# Patient Record
Sex: Female | Born: 1980 | Race: White | Hispanic: No
Health system: Southern US, Community
[De-identification: ages and names within clinical notes are randomized; demographics above are authoritative.]

---

## 2000-11-23 ENCOUNTER — Emergency Department (HOSPITAL_COMMUNITY): Admission: EM | Admit: 2000-11-23 | Discharge: 2000-11-23 | Payer: Self-pay | Admitting: Emergency Medicine

## 2001-09-26 ENCOUNTER — Encounter: Payer: Self-pay | Admitting: Emergency Medicine

## 2001-09-26 ENCOUNTER — Emergency Department (HOSPITAL_COMMUNITY): Admission: EM | Admit: 2001-09-26 | Discharge: 2001-09-26 | Payer: Self-pay | Admitting: Emergency Medicine

## 2001-11-29 ENCOUNTER — Emergency Department (HOSPITAL_COMMUNITY): Admission: EM | Admit: 2001-11-29 | Discharge: 2001-11-29 | Payer: Self-pay

## 2001-12-22 ENCOUNTER — Encounter: Payer: Self-pay | Admitting: Emergency Medicine

## 2001-12-22 ENCOUNTER — Emergency Department (HOSPITAL_COMMUNITY): Admission: EM | Admit: 2001-12-22 | Discharge: 2001-12-22 | Payer: Self-pay | Admitting: Emergency Medicine

## 2003-05-05 ENCOUNTER — Emergency Department (HOSPITAL_COMMUNITY): Admission: EM | Admit: 2003-05-05 | Discharge: 2003-05-05 | Payer: Self-pay | Admitting: Emergency Medicine

## 2003-06-02 ENCOUNTER — Emergency Department (HOSPITAL_COMMUNITY): Admission: EM | Admit: 2003-06-02 | Discharge: 2003-06-02 | Payer: Self-pay | Admitting: Emergency Medicine

## 2004-03-06 ENCOUNTER — Encounter (INDEPENDENT_AMBULATORY_CARE_PROVIDER_SITE_OTHER): Payer: Self-pay | Admitting: *Deleted

## 2004-03-06 LAB — CONVERTED CEMR LAB

## 2004-10-05 ENCOUNTER — Ambulatory Visit: Payer: Self-pay | Admitting: *Deleted

## 2004-10-14 ENCOUNTER — Ambulatory Visit: Payer: Self-pay

## 2004-10-14 ENCOUNTER — Ambulatory Visit: Payer: Self-pay | Admitting: *Deleted

## 2004-10-19 ENCOUNTER — Ambulatory Visit: Payer: Self-pay

## 2004-11-09 ENCOUNTER — Ambulatory Visit: Payer: Self-pay | Admitting: Family Medicine

## 2004-12-07 ENCOUNTER — Encounter (INDEPENDENT_AMBULATORY_CARE_PROVIDER_SITE_OTHER): Payer: Self-pay | Admitting: Specialist

## 2004-12-08 ENCOUNTER — Inpatient Hospital Stay (HOSPITAL_COMMUNITY): Admission: RE | Admit: 2004-12-08 | Discharge: 2004-12-09 | Payer: Self-pay | Admitting: General Surgery

## 2005-09-07 ENCOUNTER — Emergency Department: Payer: Self-pay | Admitting: Internal Medicine

## 2005-12-29 IMAGING — CR DG KNEE COMPLETE 4+V*L*
4 series · 4 of 4 positions shown · non-contrast
Comparison: none

CLINICAL DATA: Pain after fall.
 FOUR VIEW LEFT KNEE  
 There is no evidence of bone, joint, or soft tissue abnormality. 
 IMPRESSION
 Normal left knee series.

[view not recorded (1 of 4)]
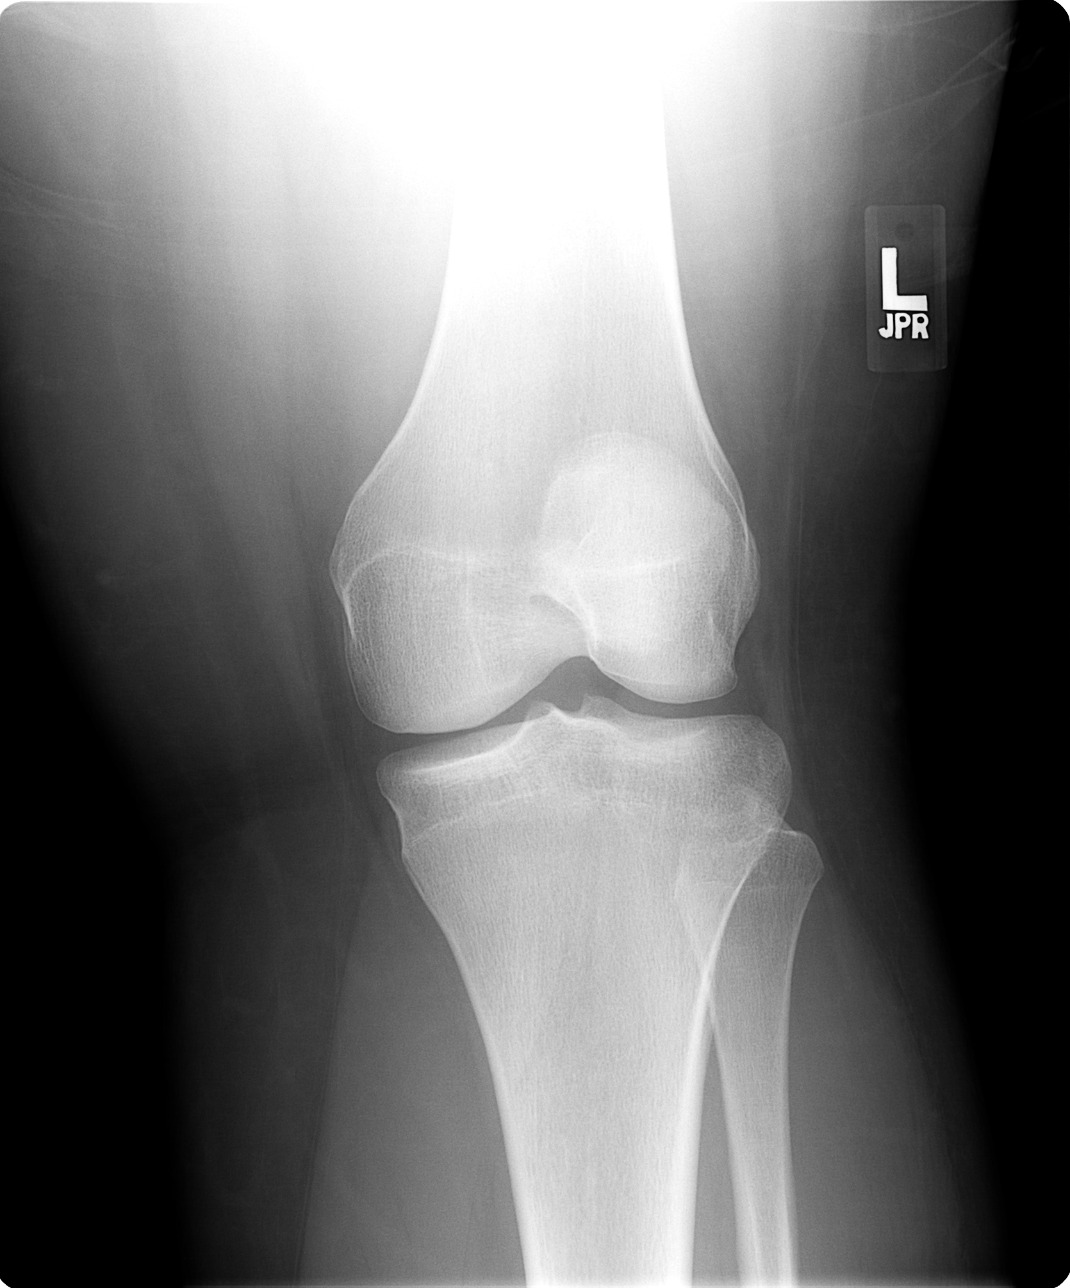

[view not recorded (2 of 4)]
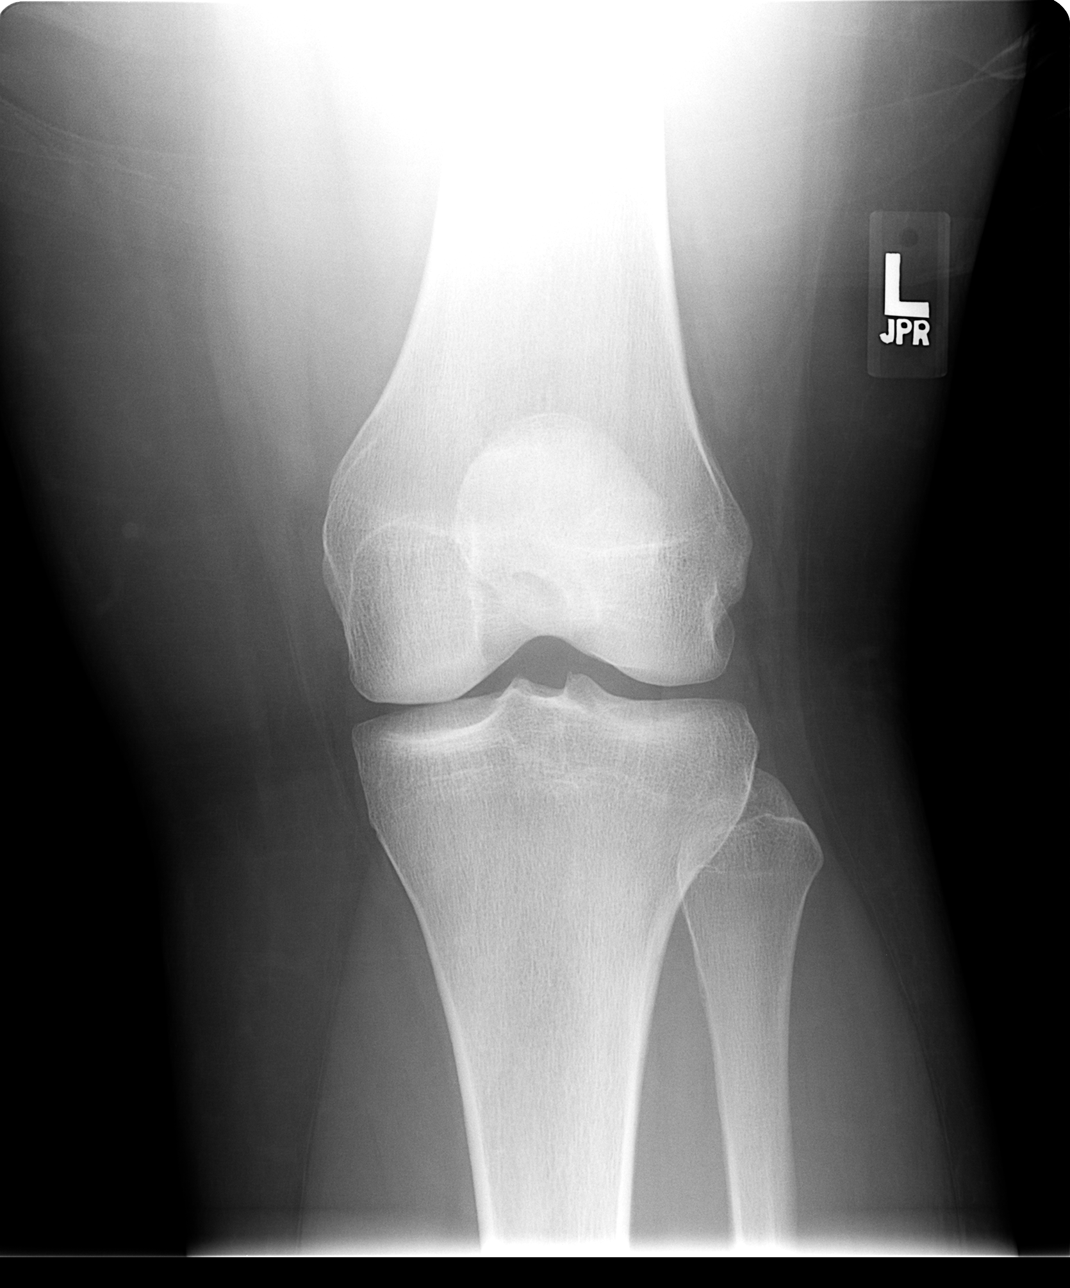

[view not recorded (3 of 4)]
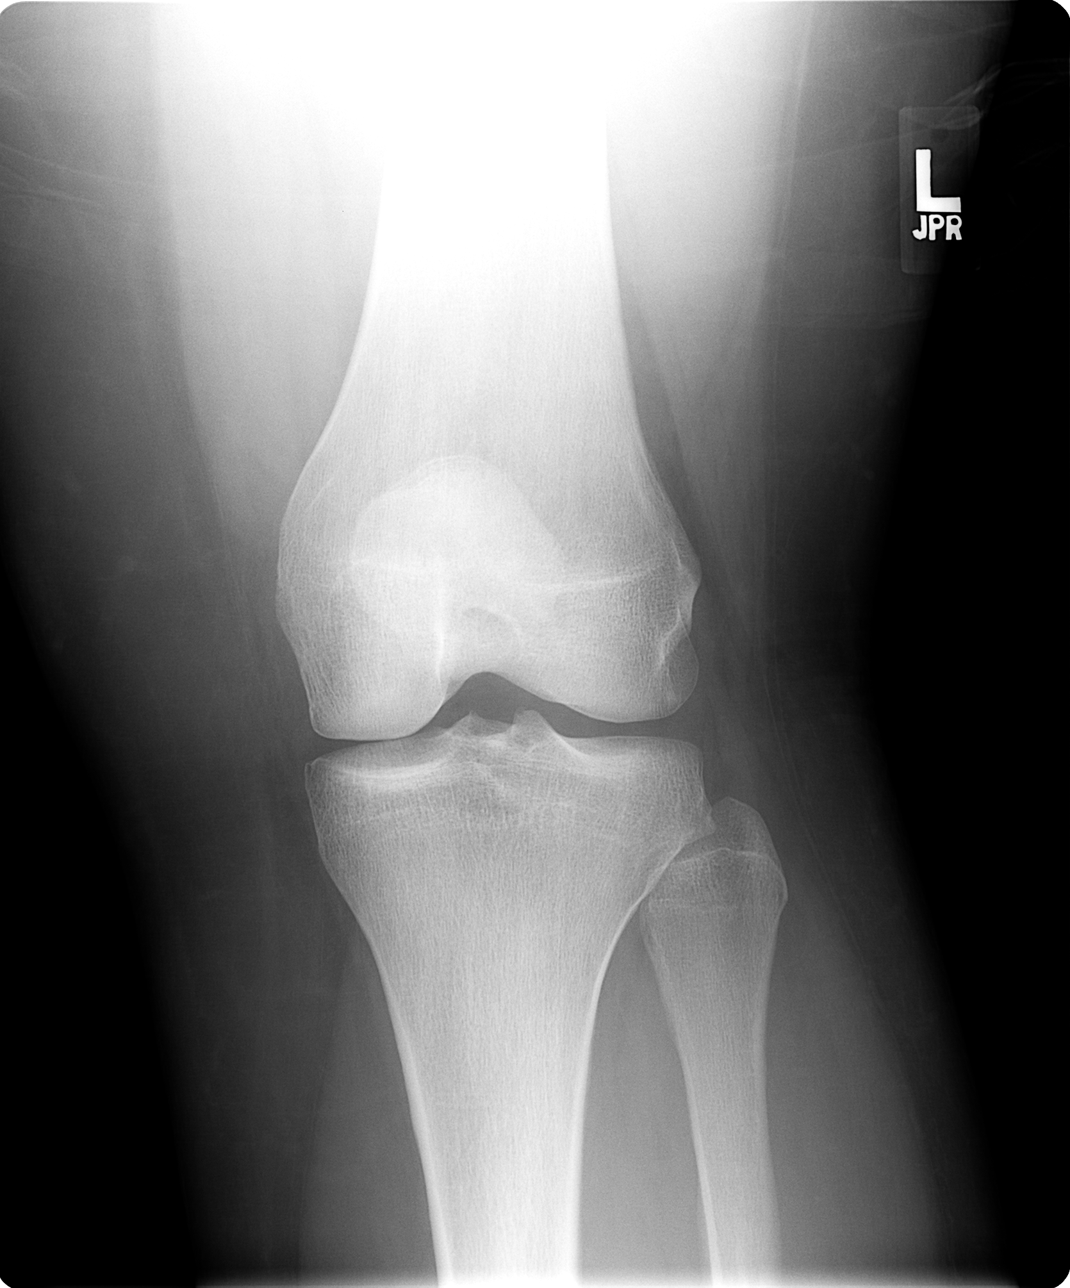

[view not recorded (4 of 4)]
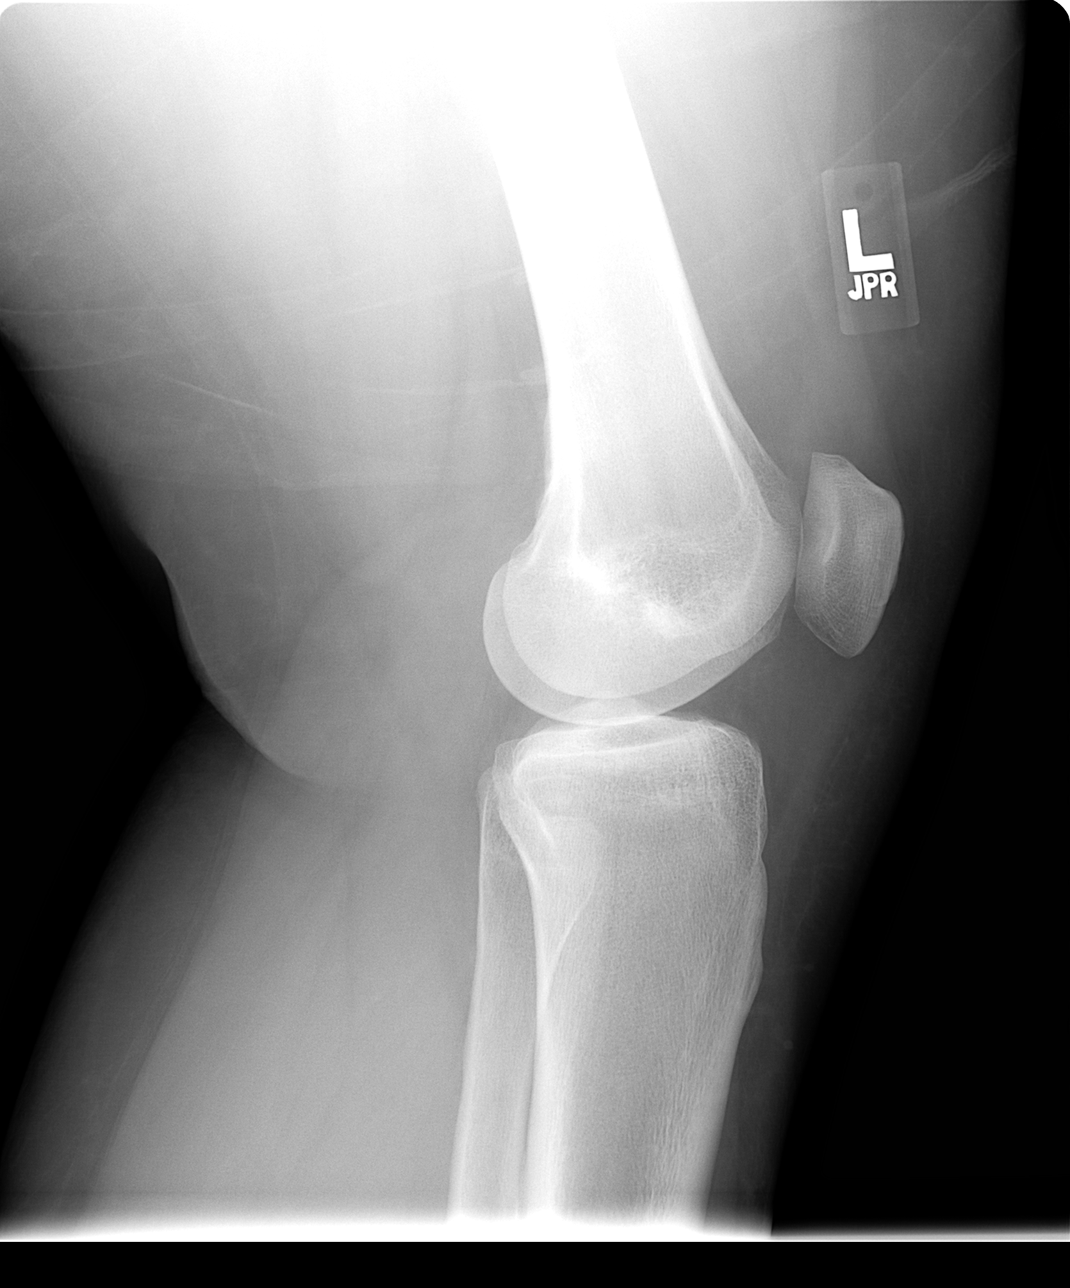

[4 of 4 positions shown; findings below may reference images not displayed]

## 2006-05-03 DIAGNOSIS — K8020 Calculus of gallbladder without cholecystitis without obstruction: Secondary | ICD-10-CM | POA: Insufficient documentation

## 2006-05-03 DIAGNOSIS — I10 Essential (primary) hypertension: Secondary | ICD-10-CM | POA: Insufficient documentation

## 2006-05-03 DIAGNOSIS — E669 Obesity, unspecified: Secondary | ICD-10-CM | POA: Insufficient documentation

## 2006-05-04 ENCOUNTER — Encounter (INDEPENDENT_AMBULATORY_CARE_PROVIDER_SITE_OTHER): Payer: Self-pay | Admitting: *Deleted

## 2008-08-20 ENCOUNTER — Emergency Department (HOSPITAL_COMMUNITY): Admission: EM | Admit: 2008-08-20 | Discharge: 2008-08-21 | Payer: Self-pay | Admitting: Emergency Medicine

## 2008-10-19 ENCOUNTER — Ambulatory Visit: Payer: Self-pay | Admitting: Endocrinology

## 2008-10-19 DIAGNOSIS — E119 Type 2 diabetes mellitus without complications: Secondary | ICD-10-CM | POA: Insufficient documentation

## 2008-11-11 ENCOUNTER — Emergency Department (HOSPITAL_COMMUNITY): Admission: EM | Admit: 2008-11-11 | Discharge: 2008-11-11 | Payer: Self-pay | Admitting: Emergency Medicine

## 2008-11-17 ENCOUNTER — Emergency Department (HOSPITAL_COMMUNITY): Admission: EM | Admit: 2008-11-17 | Discharge: 2008-11-17 | Payer: Self-pay | Admitting: Emergency Medicine

## 2008-11-28 ENCOUNTER — Emergency Department (HOSPITAL_COMMUNITY): Admission: EM | Admit: 2008-11-28 | Discharge: 2008-11-28 | Payer: Self-pay | Admitting: Emergency Medicine

## 2008-11-30 ENCOUNTER — Ambulatory Visit: Payer: Self-pay | Admitting: Endocrinology

## 2008-12-14 ENCOUNTER — Ambulatory Visit: Payer: Self-pay | Admitting: Endocrinology

## 2009-01-13 ENCOUNTER — Emergency Department (HOSPITAL_COMMUNITY): Admission: EM | Admit: 2009-01-13 | Discharge: 2009-01-13 | Payer: Self-pay | Admitting: Emergency Medicine

## 2009-03-26 ENCOUNTER — Emergency Department (HOSPITAL_COMMUNITY): Admission: EM | Admit: 2009-03-26 | Discharge: 2009-03-26 | Payer: Self-pay | Admitting: Emergency Medicine

## 2009-04-23 ENCOUNTER — Observation Stay (HOSPITAL_COMMUNITY): Admission: EM | Admit: 2009-04-23 | Discharge: 2009-04-23 | Payer: Self-pay | Admitting: Emergency Medicine

## 2009-07-21 ENCOUNTER — Observation Stay (HOSPITAL_COMMUNITY): Admission: EM | Admit: 2009-07-21 | Discharge: 2009-07-21 | Payer: Self-pay | Admitting: Emergency Medicine

## 2009-08-31 ENCOUNTER — Emergency Department (HOSPITAL_COMMUNITY): Admission: EM | Admit: 2009-08-31 | Discharge: 2009-08-31 | Payer: Self-pay | Admitting: Emergency Medicine

## 2010-02-10 ENCOUNTER — Emergency Department (HOSPITAL_COMMUNITY): Admission: EM | Admit: 2010-02-10 | Discharge: 2009-03-18 | Payer: Self-pay | Admitting: Emergency Medicine

## 2010-05-21 LAB — DIFFERENTIAL
Lymphocytes Relative: 27 % (ref 12–46)
Lymphs Abs: 2 10*3/uL (ref 0.7–4.0)
Monocytes Absolute: 0.4 10*3/uL (ref 0.1–1.0)
Monocytes Relative: 6 % (ref 3–12)
Neutro Abs: 4.6 10*3/uL (ref 1.7–7.7)

## 2010-05-21 LAB — POCT I-STAT 3, VENOUS BLOOD GAS (G3P V)
Acid-Base Excess: 2 mmol/L (ref 0.0–2.0)
Bicarbonate: 25.9 mEq/L — ABNORMAL HIGH (ref 20.0–24.0)
O2 Saturation: 97 %
TCO2: 27 mmol/L (ref 0–100)
pO2, Ven: 84 mmHg — ABNORMAL HIGH (ref 30.0–45.0)

## 2010-05-21 LAB — URINALYSIS, ROUTINE W REFLEX MICROSCOPIC
Hgb urine dipstick: NEGATIVE
Leukocytes, UA: NEGATIVE
Protein, ur: NEGATIVE mg/dL
Urobilinogen, UA: 0.2 mg/dL (ref 0.0–1.0)

## 2010-05-21 LAB — CBC
HCT: 42.4 % (ref 36.0–46.0)
Hemoglobin: 14.8 g/dL (ref 12.0–15.0)
MCHC: 34.9 g/dL (ref 30.0–36.0)
MCV: 87.6 fL (ref 78.0–100.0)
RBC: 4.84 MIL/uL (ref 3.87–5.11)
RDW: 12.3 % (ref 11.5–15.5)

## 2010-05-21 LAB — URINE CULTURE

## 2010-05-21 LAB — URINE MICROSCOPIC-ADD ON

## 2010-05-21 LAB — POCT I-STAT, CHEM 8
Chloride: 97 mEq/L (ref 96–112)
Creatinine, Ser: 0.4 mg/dL (ref 0.4–1.2)
Glucose, Bld: 473 mg/dL — ABNORMAL HIGH (ref 70–99)
Hemoglobin: 15 g/dL (ref 12.0–15.0)
Potassium: 3.2 mEq/L — ABNORMAL LOW (ref 3.5–5.1)

## 2010-05-21 LAB — GLUCOSE, CAPILLARY
Glucose-Capillary: 300 mg/dL — ABNORMAL HIGH (ref 70–99)
Glucose-Capillary: 428 mg/dL — ABNORMAL HIGH (ref 70–99)

## 2010-05-22 LAB — BASIC METABOLIC PANEL
Calcium: 8.4 mg/dL (ref 8.4–10.5)
GFR calc non Af Amer: >60 mL/min (ref >60)
Sodium: 135 mEq/L (ref 135–145)

## 2010-05-22 LAB — DIFFERENTIAL
Basophils Absolute: 0.2 10*3/uL — ABNORMAL HIGH (ref 0.0–0.1)
Eosinophils Relative: 3 % (ref 0–5)
Lymphocytes Relative: 39 % (ref 12–46)
Monocytes Absolute: 0.4 10*3/uL (ref 0.1–1.0)
Monocytes Relative: 5 % (ref 3–12)

## 2010-05-22 LAB — URINALYSIS, ROUTINE W REFLEX MICROSCOPIC
Glucose, UA: >1000 mg/dL — AB
Protein, ur: NEGATIVE mg/dL

## 2010-05-22 LAB — CBC
HCT: 41.5 % (ref 36.0–46.0)
Hemoglobin: 14.4 g/dL (ref 12.0–15.0)
MCH: 30 pg (ref 26.0–34.0)
MCV: 86.7 fL (ref 78.0–100.0)
RBC: 4.79 MIL/uL (ref 3.87–5.11)

## 2010-05-22 LAB — URINE MICROSCOPIC-ADD ON

## 2010-05-23 LAB — COMPREHENSIVE METABOLIC PANEL
AST: 60 U/L — ABNORMAL HIGH (ref 0–37)
BUN: 5 mg/dL — ABNORMAL LOW (ref 6–23)
CO2: 27 mEq/L (ref 19–32)
Calcium: 8.6 mg/dL (ref 8.4–10.5)
Creatinine, Ser: 0.55 mg/dL (ref 0.4–1.2)
GFR calc Af Amer: >60 mL/min (ref >60)
GFR calc non Af Amer: >60 mL/min (ref >60)
Glucose, Bld: 288 mg/dL — ABNORMAL HIGH (ref 70–99)
Total Bilirubin: 0.5 mg/dL (ref 0.3–1.2)

## 2010-05-23 LAB — CBC
HCT: 41.9 % (ref 36.0–46.0)
MCHC: 34.2 g/dL (ref 30.0–36.0)
MCV: 88.4 fL (ref 78.0–100.0)
RBC: 4.73 MIL/uL (ref 3.87–5.11)

## 2010-05-23 LAB — URINE MICROSCOPIC-ADD ON

## 2010-05-23 LAB — DIFFERENTIAL
Basophils Absolute: 0.2 10*3/uL — ABNORMAL HIGH (ref 0.0–0.1)
Lymphocytes Relative: 30 % (ref 12–46)
Lymphs Abs: 3.3 10*3/uL (ref 0.7–4.0)
Neutro Abs: 6.8 10*3/uL (ref 1.7–7.7)
Neutrophils Relative %: 60 % (ref 43–77)

## 2010-05-23 LAB — URINALYSIS, ROUTINE W REFLEX MICROSCOPIC
Nitrite: NEGATIVE
Protein, ur: NEGATIVE mg/dL
Specific Gravity, Urine: 1.026 (ref 1.005–1.030)
Urobilinogen, UA: 0.2 mg/dL (ref 0.0–1.0)

## 2010-05-23 LAB — LIPASE, BLOOD: Lipase: 22 U/L (ref 11–59)

## 2010-05-23 LAB — GLUCOSE, CAPILLARY
Glucose-Capillary: 243 mg/dL — ABNORMAL HIGH (ref 70–99)
Glucose-Capillary: 379 mg/dL — ABNORMAL HIGH (ref 70–99)

## 2010-05-23 LAB — POCT PREGNANCY, URINE: Preg Test, Ur: NEGATIVE

## 2010-05-25 LAB — COMPREHENSIVE METABOLIC PANEL
BUN: 6 mg/dL (ref 6–23)
CO2: 25 mEq/L (ref 19–32)
Calcium: 9.3 mg/dL (ref 8.4–10.5)
Creatinine, Ser: 0.69 mg/dL (ref 0.4–1.2)
GFR calc non Af Amer: >60 mL/min (ref >60)
Glucose, Bld: 416 mg/dL — ABNORMAL HIGH (ref 70–99)
Total Protein: 7.3 g/dL (ref 6.0–8.3)

## 2010-05-25 LAB — URINALYSIS, ROUTINE W REFLEX MICROSCOPIC
Glucose, UA: >1000 mg/dL — AB
Hgb urine dipstick: NEGATIVE
Leukocytes, UA: NEGATIVE
Protein, ur: NEGATIVE mg/dL
Specific Gravity, Urine: 1.025 (ref 1.005–1.030)
Urobilinogen, UA: 0.2 mg/dL (ref 0.0–1.0)

## 2010-05-25 LAB — GLUCOSE, CAPILLARY
Glucose-Capillary: 360 mg/dL — ABNORMAL HIGH (ref 70–99)
Glucose-Capillary: 388 mg/dL — ABNORMAL HIGH (ref 70–99)

## 2010-05-25 LAB — DIFFERENTIAL
Eosinophils Absolute: 0.5 10*3/uL (ref 0.0–0.7)
Lymphocytes Relative: 22 % (ref 12–46)
Lymphs Abs: 2.8 10*3/uL (ref 0.7–4.0)
Monocytes Relative: 4 % (ref 3–12)
Neutro Abs: 9.1 10*3/uL — ABNORMAL HIGH (ref 1.7–7.7)
Neutrophils Relative %: 70 % (ref 43–77)

## 2010-05-25 LAB — LIPASE, BLOOD: Lipase: 17 U/L (ref 11–59)

## 2010-05-25 LAB — URINE MICROSCOPIC-ADD ON

## 2010-05-25 LAB — CBC
Hemoglobin: 14.7 g/dL (ref 12.0–15.0)
MCHC: 34 g/dL (ref 30.0–36.0)
MCV: 88.9 fL (ref 78.0–100.0)
RBC: 4.87 MIL/uL (ref 3.87–5.11)
RDW: 12.8 % (ref 11.5–15.5)

## 2010-05-25 LAB — POCT PREGNANCY, URINE: Preg Test, Ur: NEGATIVE

## 2010-06-08 LAB — DIFFERENTIAL
Basophils Absolute: 0.2 10*3/uL — ABNORMAL HIGH (ref 0.0–0.1)
Basophils Relative: 2 % — ABNORMAL HIGH (ref 0–1)
Eosinophils Absolute: 0.4 10*3/uL (ref 0.0–0.7)
Monocytes Relative: 4 % (ref 3–12)
Neutro Abs: 5.1 10*3/uL (ref 1.7–7.7)
Neutrophils Relative %: 54 % (ref 43–77)

## 2010-06-08 LAB — BASIC METABOLIC PANEL
BUN: 4 mg/dL — ABNORMAL LOW (ref 6–23)
CO2: 26 mEq/L (ref 19–32)
Calcium: 8.5 mg/dL (ref 8.4–10.5)
Creatinine, Ser: 0.61 mg/dL (ref 0.4–1.2)
GFR calc Af Amer: >60 mL/min (ref >60)

## 2010-06-08 LAB — POCT CARDIAC MARKERS
CKMB, poc: <1 ng/mL — ABNORMAL LOW (ref 1.0–8.0)
Myoglobin, poc: 42.8 ng/mL (ref 12–200)
Troponin i, poc: <0.05 ng/mL (ref 0.00–0.09)

## 2010-06-08 LAB — CBC
MCHC: 34.8 g/dL (ref 30.0–36.0)
Platelets: 215 10*3/uL (ref 150–400)
RBC: 4.73 MIL/uL (ref 3.87–5.11)
RDW: 12.8 % (ref 11.5–15.5)

## 2010-06-10 LAB — URINALYSIS, ROUTINE W REFLEX MICROSCOPIC
Bilirubin Urine: NEGATIVE
Bilirubin Urine: NEGATIVE
Glucose, UA: >1000 mg/dL — AB
Glucose, UA: >1000 mg/dL — AB
Hgb urine dipstick: NEGATIVE
Hgb urine dipstick: NEGATIVE
Ketones, ur: NEGATIVE mg/dL
Ketones, ur: NEGATIVE mg/dL
Leukocytes, UA: NEGATIVE
Nitrite: NEGATIVE
Protein, ur: NEGATIVE mg/dL
Protein, ur: NEGATIVE mg/dL
Specific Gravity, Urine: 1.027 (ref 1.005–1.030)
Urobilinogen, UA: 0.2 mg/dL (ref 0.0–1.0)
pH: 6 (ref 5.0–8.0)
pH: 6 (ref 5.0–8.0)

## 2010-06-10 LAB — GLUCOSE, CAPILLARY
Glucose-Capillary: 337 mg/dL — ABNORMAL HIGH (ref 70–99)
Glucose-Capillary: 353 mg/dL — ABNORMAL HIGH (ref 70–99)
Glucose-Capillary: 359 mg/dL — ABNORMAL HIGH (ref 70–99)
Glucose-Capillary: 231 mg/dL — ABNORMAL HIGH (ref 70–99)
Glucose-Capillary: 440 mg/dL — ABNORMAL HIGH (ref 70–99)

## 2010-06-10 LAB — URINE MICROSCOPIC-ADD ON

## 2010-06-10 LAB — POCT PREGNANCY, URINE
Preg Test, Ur: NEGATIVE
Preg Test, Ur: NEGATIVE
Preg Test, Ur: NEGATIVE

## 2010-06-10 LAB — POCT I-STAT, CHEM 8
BUN: 4 mg/dL — ABNORMAL LOW (ref 6–23)
BUN: 6 mg/dL (ref 6–23)
Calcium, Ion: 1.1 mmol/L — ABNORMAL LOW (ref 1.12–1.32)
Calcium, Ion: 1.12 mmol/L (ref 1.12–1.32)
Chloride: 98 mEq/L (ref 96–112)
Chloride: 98 mEq/L (ref 96–112)
Creatinine, Ser: 0.4 mg/dL (ref 0.4–1.2)
Creatinine, Ser: 0.6 mg/dL (ref 0.4–1.2)
Creatinine, Ser: 0.6 mg/dL (ref 0.4–1.2)
HCT: 49 % — ABNORMAL HIGH (ref 36.0–46.0)
Hemoglobin: 16.7 g/dL — ABNORMAL HIGH (ref 12.0–15.0)
Potassium: 3.6 mEq/L (ref 3.5–5.1)
Sodium: 137 mEq/L (ref 135–145)
TCO2: 26 mmol/L (ref 0–100)
TCO2: 24 mmol/L (ref 0–100)

## 2010-06-10 LAB — POCT I-STAT 3, VENOUS BLOOD GAS (G3P V)
Acid-Base Excess: 5 mmol/L — ABNORMAL HIGH (ref 0.0–2.0)
Bicarbonate: 29.5 mEq/L — ABNORMAL HIGH (ref 20.0–24.0)
O2 Saturation: 79 %
TCO2: 31 mmol/L (ref 0–100)

## 2010-06-10 LAB — KETONES, QUALITATIVE: Acetone, Bld: NEGATIVE

## 2010-06-10 LAB — CBC
HCT: 42.2 % (ref 36.0–46.0)
HCT: 44 % (ref 36.0–46.0)
Hemoglobin: 14.4 g/dL (ref 12.0–15.0)
Hemoglobin: 14.8 g/dL (ref 12.0–15.0)
MCV: 87.4 fL (ref 78.0–100.0)
MCV: 88.1 fL (ref 78.0–100.0)
MCV: 88.7 fL (ref 78.0–100.0)
Platelets: 285 10*3/uL (ref 150–400)
RBC: 4.82 MIL/uL (ref 3.87–5.11)
RDW: 12.4 % (ref 11.5–15.5)
WBC: 9.2 10*3/uL (ref 4.0–10.5)
WBC: 10.7 10*3/uL — ABNORMAL HIGH (ref 4.0–10.5)

## 2010-06-10 LAB — DIFFERENTIAL
Basophils Absolute: 0.1 10*3/uL (ref 0.0–0.1)
Eosinophils Absolute: 0.3 10*3/uL (ref 0.0–0.7)
Eosinophils Absolute: 0.4 10*3/uL (ref 0.0–0.7)
Eosinophils Relative: 3 % (ref 0–5)
Eosinophils Relative: 4 % (ref 0–5)
Lymphocytes Relative: 39 % (ref 12–46)
Lymphocytes Relative: 32 % (ref 12–46)
Lymphs Abs: 3.6 10*3/uL (ref 0.7–4.0)
Lymphs Abs: 3.3 10*3/uL (ref 0.7–4.0)
Lymphs Abs: 3.4 10*3/uL (ref 0.7–4.0)
Monocytes Absolute: 0.3 10*3/uL (ref 0.1–1.0)
Monocytes Relative: 5 % (ref 3–12)
Neutro Abs: 6.4 10*3/uL (ref 1.7–7.7)
Neutrophils Relative %: 60 % (ref 43–77)

## 2010-07-22 NOTE — Op Note (Signed)
NAME:  Mary Hahn, Mary Hahn                  ACCOUNT NO.:  [id number]   MEDICAL RECORD NO.:  [id number]          PATIENT TYPE:  AMB   LOCATION:  SDS                          FACILITY:  [facility]   PHYSICIAN:  Ollen Gross. Vernell Morgans, M.D. DATE OF BIRTH:  Jun 25, 1980   DATE OF PROCEDURE:  12/07/2004  DATE OF DISCHARGE:                                 OPERATIVE REPORT   PREOPERATIVE DIAGNOSIS:  Biliary dyskinesia.   POSTOP DIAGNOSIS:  Biliary dyskinesia.   PROCEDURE:  Laparoscopic cholecystectomy.   SURGEON:  Ollen Gross. Carolynne Edouard, M.D.   ASSISTANT:  Rose Phi. Maple Hudson, M.D.   ANESTHESIA:  General endotracheal.   PROCEDURE:  After informed consent was obtained, the patient was brought to  the operating room, placed in supine position on the operating table. After  induction of general anesthesia, the patient's abdomen was prepped with  Betadine and draped in usual sterile manner. The area above the umbilicus  was infiltrated 4% Marcaine. The small incision was made with a 15 blade  knife. This incision was carried down through the subcutaneous tissue  bluntly with a hemostat and [employer] retractors until the linea alba was  identified.  The linea alba was incised with a 15 blade knife.  Each side  was grasped Kocher clamps and elevated anteriorly. The preperitoneal space  was then probed bluntly with a hemostat until the peritoneum was opened and  access was gained to the abdominal cavity.  0 Vicryl pursestring stitch was  placed in the fascia around the opening. Hasson cannula was placed through  the opening anchored in place with the previously placed Vicryl pursestring  stitch.  The abdomen was then insufflated with carbon dioxide without  difficulty. The patient was placed in head-up position rotated slightly with  right side up. Next the epigastric region was infiltrated with 0.25%  Marcaine. A small incision was made with a 15 blade knife. A 10 mm port was  then placed bluntly through this incision into  the abdominal cavity under  direct vision. Sites were then chosen on the right side of the abdomen  laterally for placement of 5 mm ports. Each of these areas was infiltrated  with 0.25% Marcaine and small stab incisions were made with a 15 blade knife  and 5 mm ports were placed bluntly through these incisions into the  abdominal cavity under direct vision. A blunt grasper was placed through the  lateral most 5 mm port and used to grasp the dome of gallbladder and try to  elevate it anteriorly and superiorly. Another blunt grasper was placed  through the other 5 mm port and used to retract on the body and neck of the  gallbladder.  Because of the patient's body habitus, it was difficult to get  enough insufflation to see the lower portion of the gallbladder.  At this  point, another 5 meters port was chosen to be placed just above the  umbilicus on the left side of the abdomen. This area was infiltrated 0.25%  Marcaine. A small stab incision was made with 15 blade knife and a 5  mm port  was placed bluntly through this incision into the abdominal cavity under  direct vision.  A blunt grasper was placed through this port and used to  push down on the fatty omental tissue and duodenum that was in the way. Once  this was accomplished, we were able to visualize the neck of the  gallbladder.  We were able to identify the gallbladder neck cystic duct  junction and dissect this in a circumferential manner.  Because of the  visualization and her body habitus, we were not able to get a cholangiogram.  We were able to put two clips across the cystic duct proximally and one  distally and the duct was divided between two sets of clips. Posterior this,  the cystic artery was identified again dissected bluntly in a  circumferential manner. Two clips were placed proximally and one distally on  the artery and the artery was divided between the two. Next a laparoscopic  hook cautery device was used to  separate the gallbladder from the liver bed.  A small venous lake was encountered that was controlled with cautery.  The  gallbladder was then again separated from the liver bed using the hook  cautery. Prior to completely detaching the gallbladder, the liver bed was  inspected and several small bleeding points coagulated with the  electrocautery until the area was completely hemostatic. The gallbladder was  then detached the rest of the way from the liver bed without difficulty. A  laparoscopic bag was placed through the epigastric port. The gallbladder was  placed within the bag and the bag was sealed. The abdomen was then irrigated  with copious amounts of saline until the effluent was clear. The laparoscope  was then moved to the epigastric port. The gallbladder grasper was placed  through the Hasson cannula and used to grasp the bag. The bag with the  gallbladder was then removed through the supraumbilical port without  difficulty. The fascial defect was closed with the previously placed with  the previously placed Vicryl pursestring stitch as well as another  interrupted figure-of-eight zero Vicryl stitch. The rest of ports were then  removed under direct vision and were found to be hemostatic. Gas was allowed  to escape. The skin incisions were all closed with interrupted 4-0 Monocryl  subcuticular stitches. Benzoin, Steri-Strips and sterile dressings were  applied. The patient tolerated well.  At the end of the case, all needle  sponge and instrument counts correct. The patient was then awakened and  taken recovery room in stable condition.      Ollen Gross. Vernell Morgans, M.D.  Electronically Signed     PST/MEDQ  D:  12/07/2004  T:  12/07/2004  Job:  [id number]

## 2017-11-08 ENCOUNTER — Encounter: Payer: Self-pay | Admitting: Sports Medicine

## 2017-11-08 ENCOUNTER — Other Ambulatory Visit: Payer: Self-pay

## 2017-11-08 ENCOUNTER — Ambulatory Visit: Payer: Medicaid Other | Admitting: Sports Medicine

## 2017-11-08 DIAGNOSIS — M79674 Pain in right toe(s): Secondary | ICD-10-CM

## 2017-11-08 DIAGNOSIS — M79675 Pain in left toe(s): Secondary | ICD-10-CM | POA: Diagnosis not present

## 2017-11-08 DIAGNOSIS — B351 Tinea unguium: Secondary | ICD-10-CM

## 2017-11-08 DIAGNOSIS — IMO0002 Reserved for concepts with insufficient information to code with codable children: Secondary | ICD-10-CM

## 2017-11-08 DIAGNOSIS — E1142 Type 2 diabetes mellitus with diabetic polyneuropathy: Secondary | ICD-10-CM | POA: Diagnosis not present

## 2017-11-08 DIAGNOSIS — E119 Type 2 diabetes mellitus without complications: Secondary | ICD-10-CM

## 2017-11-08 DIAGNOSIS — L84 Corns and callosities: Secondary | ICD-10-CM

## 2017-11-08 NOTE — Progress Notes (Signed)
Subjective: Mary Hahn is a 37 y.o. female patient with history of diabetes who presents to office today complaining of long,mildly painful nails  while ambulating in shoes; unable to trim. Patient states that the glucose reading this morning was 180 mg/dl and last H5K 7.8 and reports that she last saw her primary care doctor on Friday. Patient denies any new changes in medication or new problems. Patient admits been diagnosed with diabetes early 2000 and loss of second toe in April of last year secondary to bone infection on right foot patient also requests diabetic shoes.  Review of Systems  Neurological: Positive for sensory change.  All other systems reviewed and are negative.    Patient Active Problem List   Diagnosis Date Noted  . DM 10/19/2008  . OBESITY, NOS 05/03/2006  . HYPERTENSION, BENIGN SYSTEMIC 05/03/2006  . CHOLELITHIASIS, NOS 05/03/2006   No current outpatient medications on file prior to visit.   No current facility-administered medications on file prior to visit.    Allergies  Allergen Reactions  . Penicillins     No results found for this or any previous visit (from the past 2160 hour(s)).  Objective: General: Patient is awake, alert, and oriented x 3 and in no acute distress.  Integument: Skin is warm, dry and supple bilateral. Nails are tender, long, thickened and dystrophic with subungual debris, consistent with onychomycosis, 1-5 on left and 1, 3 through 5 on right with no signs of infection. No open lesions or preulcerative lesions present bilateral.  Diffuse callusing bilateral heels and right first toe.  Remaining integument unremarkable.  Vasculature:  Dorsalis Pedis pulse 0/4 bilateral. Posterior Tibial pulse  1/4 bilateral. Capillary fill time <5 sec 1-5 bilateral.  Diminished hair growth to the level of the digits.Temperature gradient within normal limits. No varicosities present bilateral. No edema present bilateral.   Neurology: The patient has  absent sensation measured with a 5.07/10g Semmes Weinstein Monofilament at all pedal sites bilateral . Vibratory sensation diminished bilateral with tuning fork. No Babinski sign present bilateral.   Musculoskeletal: Asymptomatic hammertoe pedal deformities noted bilateral.  Status post right second toe amputation.  Muscular strength 5/5 in all lower extremity muscular groups bilateral without pain on range of motion . No tenderness with calf compression bilateral.  Assessment and Plan: Problem List Items Addressed This Visit    None    Visit Diagnoses    Comprehensive diabetic foot examination, type 2 DM, encounter for (HCC)    -  Primary   Callus of foot       Pain due to onychomycosis of toenails of both feet       Toe amputation status, right (HCC)       Diabetic polyneuropathy associated with type 2 diabetes mellitus (HCC)         -Examined patient. -Discussed and educated patient on diabetic foot care, especially with  regards to the vascular, neurological and musculoskeletal systems.  -Stressed the importance of good glycemic control and the detriment of not  controlling glucose levels in relation to the foot. -Mechanically debrided all nails x9 using sterile nail nipper and filed with dremel without incident and mechanically smooth callus at heels and first toe using bur without incident -Patient was made aware that her diabetic shoes are not covered by her insurance thus patient decided not to get them -Answered all patient questions -Patient to return  in 3 months for at risk foot care -Patient advised to call the office if any problems or  questions arise in the meantime.  Landis Martins, DPM

## 2017-11-08 NOTE — Progress Notes (Signed)
   Subjective:    Patient ID: Mary Hahn, female    DOB: Jan 05, 1981, 37 y.o.   MRN: [id number]  HPI    Review of Systems  All other systems reviewed and are negative.      Objective:   Physical Exam        Assessment & Plan:

## 2017-11-08 NOTE — Patient Instructions (Signed)

## 2018-01-29 DIAGNOSIS — R0600 Dyspnea, unspecified: Secondary | ICD-10-CM

## 2018-02-07 ENCOUNTER — Ambulatory Visit: Payer: Medicaid Other | Admitting: Sports Medicine

## 2018-03-04 DIAGNOSIS — R918 Other nonspecific abnormal finding of lung field: Secondary | ICD-10-CM
